# Patient Record
Sex: Male | Born: 1985 | Race: White | Hispanic: No | Marital: Single | Smoking: Current every day smoker
Health system: Southern US, Community
[De-identification: ages and names within clinical notes are randomized; demographics above are authoritative.]

## PROBLEM LIST (undated history)

## (undated) DIAGNOSIS — K4090 Unilateral inguinal hernia, without obstruction or gangrene, not specified as recurrent: Secondary | ICD-10-CM

## (undated) HISTORY — PX: TONSILLECTOMY: SUR1361

---

## 2014-12-20 DIAGNOSIS — N433 Hydrocele, unspecified: Secondary | ICD-10-CM | POA: Insufficient documentation

## 2015-01-10 DIAGNOSIS — K4090 Unilateral inguinal hernia, without obstruction or gangrene, not specified as recurrent: Secondary | ICD-10-CM | POA: Insufficient documentation

## 2015-12-01 ENCOUNTER — Emergency Department (HOSPITAL_COMMUNITY)
Admission: EM | Admit: 2015-12-01 | Discharge: 2015-12-02 | Disposition: A | Discharge status: Home / Self Care | Payer: BLUE CROSS/BLUE SHIELD | Attending: Emergency Medicine | Admitting: Emergency Medicine

## 2015-12-01 ENCOUNTER — Encounter (HOSPITAL_COMMUNITY): Payer: Self-pay | Admitting: *Deleted

## 2015-12-01 ENCOUNTER — Emergency Department (HOSPITAL_COMMUNITY): Payer: BLUE CROSS/BLUE SHIELD

## 2015-12-01 DIAGNOSIS — Y9389 Activity, other specified: Secondary | ICD-10-CM | POA: Insufficient documentation

## 2015-12-01 DIAGNOSIS — M542 Cervicalgia: Secondary | ICD-10-CM | POA: Insufficient documentation

## 2015-12-01 DIAGNOSIS — S80212A Abrasion, left knee, initial encounter: Secondary | ICD-10-CM | POA: Insufficient documentation

## 2015-12-01 DIAGNOSIS — Y999 Unspecified external cause status: Secondary | ICD-10-CM | POA: Diagnosis not present

## 2015-12-01 DIAGNOSIS — F17210 Nicotine dependence, cigarettes, uncomplicated: Secondary | ICD-10-CM | POA: Diagnosis not present

## 2015-12-01 DIAGNOSIS — T148 Other injury of unspecified body region: Secondary | ICD-10-CM | POA: Diagnosis not present

## 2015-12-01 DIAGNOSIS — S30810A Abrasion of lower back and pelvis, initial encounter: Secondary | ICD-10-CM | POA: Insufficient documentation

## 2015-12-01 DIAGNOSIS — S63502A Unspecified sprain of left wrist, initial encounter: Secondary | ICD-10-CM | POA: Diagnosis not present

## 2015-12-01 DIAGNOSIS — M25572 Pain in left ankle and joints of left foot: Secondary | ICD-10-CM | POA: Diagnosis not present

## 2015-12-01 DIAGNOSIS — S6992XA Unspecified injury of left wrist, hand and finger(s), initial encounter: Secondary | ICD-10-CM | POA: Diagnosis present

## 2015-12-01 DIAGNOSIS — Y92410 Unspecified street and highway as the place of occurrence of the external cause: Secondary | ICD-10-CM | POA: Diagnosis not present

## 2015-12-01 DIAGNOSIS — T148XXA Other injury of unspecified body region, initial encounter: Secondary | ICD-10-CM

## 2015-12-01 HISTORY — DX: Unilateral inguinal hernia, without obstruction or gangrene, not specified as recurrent: K40.90
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
## 2015-12-01 NOTE — ED Notes (Signed)
Patient transported to X-ray 
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
## 2015-12-02 MED ORDER — CYCLOBENZAPRINE HCL 10 MG PO TABS: 10.0000 mg | ORAL_TABLET | Freq: Three times a day (TID) | ORAL | Status: DC | PRN

## 2015-12-02 MED ORDER — TRAMADOL HCL 50 MG PO TABS: 50.0000 mg | ORAL_TABLET | Freq: Four times a day (QID) | ORAL | Status: DC | PRN

## 2015-12-02 NOTE — Discharge Instructions (Signed)
Wrist Sprain A wrist sprain is a stretch or tear in the strong, fibrous tissues (ligaments) that connect your wrist bones. The ligaments of your wrist may be easily sprained. There are three types of wrist sprains.  Grade 1. The ligament is not stretched or torn, but the sprain causes pain.  Grade 2. The ligament is stretched or partially torn. You may be able to move your wrist, but not very much.  Grade 3. The ligament or muscle completely tears. You may find it difficult or extremely painful to move your wrist even a little. CAUSES Often, wrist sprains are a result of a fall or an injury. The force of the impact causes the fibers of your ligament to stretch too much or tear. Common causes of wrist sprains include:  Overextending your wrist while catching a ball with your hands.  Repetitive or strenuous extension or bending of your wrist.  Landing on your hand during a fall. RISK FACTORS  Having previous wrist injuries.  Playing contact sports, such as boxing or wrestling.  Participating in activities in which falling is common.  Having poor wrist strength and flexibility. SIGNS AND SYMPTOMS  Wrist pain.  Wrist tenderness.  Inflammation or bruising of the wrist area.  Hearing a "pop" or feeling a tear at the time of the injury.  Decreased wrist movement due to pain, stiffness, or weakness. DIAGNOSIS Your health care provider will examine your wrist. In some cases, an X-ray will be taken to make sure you did not break any bones. If your health care provider thinks that you tore a ligament, he or she may order an MRI of your wrist. TREATMENT Treatment involves resting and icing your wrist. You may also need to take pain medicines to help lessen pain and inflammation. Your health care provider may recommend keeping your wrist still (immobilized) with a splint to help your sprain heal. When the splint is no longer necessary, you may need to perform strengthening and stretching  exercises. These exercises help you to regain strength and full range of motion in your wrist. Surgery is not usually needed for wrist sprains unless the ligament completely tears. HOME CARE INSTRUCTIONS  Rest your wrist. Do not do things that cause pain.  Wear your wrist splint as directed by your health care provider.  Take medicines only as directed by your health care provider.  To ease pain and swelling, apply ice to the injured area.  Put ice in a plastic bag.  Place a towel between your skin and the bag.  Leave the ice on for 20 minutes, 2-3 times a day. SEEK MEDICAL CARE IF:  Your pain, discomfort, or swelling gets worse even with treatment.  You feel sudden numbness in your hand.   This information is not intended to replace advice given to you by your health care provider. Make sure you discuss any questions you have with your health care provider.   Document Released: 01/18/2014 Document Reviewed: 01/18/2014 Elsevier Interactive Patient Education 2016 [employer] Contusion A contusion is a deep bruise. Contusions are the result of a blunt injury to tissues and muscle fibers under the skin. The injury causes bleeding under the skin. The skin overlying the contusion may turn blue, purple, or yellow. Minor injuries will give you a painless contusion, but more severe contusions may stay painful and swollen for a few weeks.  CAUSES  This condition is usually caused by a blow, trauma, or direct force to an area of the body. SYMPTOMS  Symptoms of this condition include:  Swelling of the injured area.  Pain and tenderness in the injured area.  Discoloration. The area may have redness and then turn blue, purple, or yellow. DIAGNOSIS  This condition is diagnosed based on a physical exam and medical history. An X-ray, CT scan, or MRI may be needed to determine if there are any associated injuries, such as broken bones (fractures). TREATMENT  Specific treatment for this  condition depends on what area of the body was injured. In general, the best treatment for a contusion is resting, icing, applying pressure to (compression), and elevating the injured area. This is often called the RICE strategy. Over-the-counter anti-inflammatory medicines may also be recommended for pain control.  HOME CARE INSTRUCTIONS   Rest the injured area.  If directed, apply ice to the injured area:  Put ice in a plastic bag.  Place a towel between your skin and the bag.  Leave the ice on for 20 minutes, 2-3 times per day.  If directed, apply light compression to the injured area using an elastic bandage. Make sure the bandage is not wrapped too tightly. Remove and reapply the bandage as directed by your health care provider.  If possible, raise (elevate) the injured area above the level of your heart while you are sitting or lying down.  Take over-the-counter and prescription medicines only as told by your health care provider. SEEK MEDICAL CARE IF:  Your symptoms do not improve after several days of treatment.  Your symptoms get worse.  You have difficulty moving the injured area. SEEK IMMEDIATE MEDICAL CARE IF:   You have severe pain.  You have numbness in a hand or foot.  Your hand or foot turns pale or cold.   This information is not intended to replace advice given to you by your health care provider. Make sure you discuss any questions you have with your health care provider.   Document Released: 02/24/2005 Document Revised: 02/05/2015 Document Reviewed: 10/02/2014 Elsevier Interactive Patient Education 2016 Elsevier Inc. Abrasion An abrasion is a cut or scrape on the outer surface of your skin. An abrasion does not extend through all of the layers of your skin. It is important to care for your abrasion properly to prevent infection. CAUSES Most abrasions are caused by falling on or gliding across the ground or another surface. When your skin rubs on something,  the outer and inner layer of skin rubs off.  SYMPTOMS A cut or scrape is the main symptom of this condition. The scrape may be bleeding, or it may appear red or pink. If there was an associated fall, there may be an underlying bruise. DIAGNOSIS An abrasion is diagnosed with a physical exam. TREATMENT Treatment for this condition depends on how large and deep the abrasion is. Usually, your abrasion will be cleaned with water and mild soap. This removes any dirt or debris that may be stuck. An antibiotic ointment may be applied to the abrasion to help prevent infection. A bandage (dressing) may be placed on the abrasion to keep it clean. You may also need a tetanus shot. HOME CARE INSTRUCTIONS Medicines  Take or apply medicines only as directed by your health care provider.  If you were prescribed an antibiotic ointment, finish all of it even if you start to feel better. Wound Care  Clean the wound with mild soap and water 2-3 times per day or as directed by your health care provider. Pat your wound dry with a clean towel.  Do not rub it.  There are many different ways to close and cover a wound. Follow instructions from your health care provider about:  Wound care.  Dressing changes and removal.  Check your wound every day for signs of infection. Watch for:  Redness, swelling, or pain.  Fluid, blood, or pus. General Instructions  Keep the dressing dry as directed by your health care provider. Do not take baths, swim, use a hot tub, or do anything that would put your wound underwater until your health care provider approves.  If there is swelling, raise (elevate) the injured area above the level of your heart while you are sitting or lying down.  Keep all follow-up visits as directed by your health care provider. This is important. SEEK MEDICAL CARE IF:  You received a tetanus shot and you have swelling, severe pain, redness, or bleeding at the injection site.  Your pain is not  controlled with medicine.  You have increased redness, swelling, or pain at the site of your wound. SEEK IMMEDIATE MEDICAL CARE IF:  You have a red streak going away from your wound.  You have a fever.  You have fluid, blood, or pus coming from your wound.  You notice a bad smell coming from your wound or your dressing.   This information is not intended to replace advice given to you by your health care provider. Make sure you discuss any questions you have with your health care provider.   Document Released: 02/24/2005 Document Revised: 02/05/2015 Document Reviewed: 05/15/2014 Elsevier Interactive Patient Education [employer]2016 Elsevier Inc[employer]
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
## 2022-07-26 ENCOUNTER — Ambulatory Visit (INDEPENDENT_AMBULATORY_CARE_PROVIDER_SITE_OTHER): Payer: 59 | Admitting: Family

## 2022-07-26 ENCOUNTER — Encounter: Payer: Self-pay | Admitting: Family

## 2022-07-26 VITALS — BP 134/88 | HR 92 | Temp 97.3°F | Resp 20 | Ht 69.0 in | Wt 227.0 lb

## 2022-07-26 DIAGNOSIS — Z1159 Encounter for screening for other viral diseases: Secondary | ICD-10-CM

## 2022-07-26 DIAGNOSIS — Z6833 Body mass index (BMI) 33.0-33.9, adult: Secondary | ICD-10-CM

## 2022-07-26 DIAGNOSIS — Z789 Other specified health status: Secondary | ICD-10-CM

## 2022-07-26 DIAGNOSIS — Z7689 Persons encountering health services in other specified circumstances: Secondary | ICD-10-CM

## 2022-07-26 DIAGNOSIS — Z23 Encounter for immunization: Secondary | ICD-10-CM

## 2022-07-26 DIAGNOSIS — F17210 Nicotine dependence, cigarettes, uncomplicated: Secondary | ICD-10-CM

## 2022-07-26 DIAGNOSIS — Z113 Encounter for screening for infections with a predominantly sexual mode of transmission: Secondary | ICD-10-CM

## 2022-07-26 DIAGNOSIS — Z13220 Encounter for screening for lipoid disorders: Secondary | ICD-10-CM

## 2022-07-26 NOTE — Progress Notes (Signed)
Provider: Marlowe Sax FNP-C   Conley Delisle, Nelda Bucks, NP  Patient Care Team: Joie Hipps, Nelda Bucks, NP as PCP - General (Family Medicine)  Extended Emergency Contact Information Primary Emergency Contact: Francie Massing [place] Phone: [phone] Relation: Mother Secondary Emergency Contact: Schneider,Remick [place] Phone: [phone] Relation: Relative Preferred language: English Interpreter needed? No  Code Status:  Full Code  Goals of care: Advanced Directive information    12/01/2015   10:15 PM  Advanced Directives  Does Patient Have a Medical Advance Directive? No  Would patient like information on creating a medical advance directive? No - patient declined information     Chief Complaint  Patient presents with   New Patient (Initial Visit)    Patient presents today for a new patient.    HPI:  Pt is a 37 y.o. male seen today establish care here at [facility].States had no chronic medical condition. States trying to stay health.  Has changed his diet recently more veggies,lots of milk and yogurt since new year.  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   Drinks alcohol twice a month.Has cut down states used to drink a lot.  Has also cut down on smoking cigarettes down to 1/2 pack per day for the past 16 yrs.   Has no know allergies but has allergies to dust,Hay,grass,pollen and cat litter.itching ,sneezing and difficult breathing with exposure to allergens.No anaphylaxis.  Past Medical History:  Diagnosis Date   Inguinal hernia    Past Surgical History:  Procedure Laterality Date   TONSILLECTOMY      Allergies  Allergen Reactions   Cat Hair Extract     Cat litter causes itching and sneezing    Dust Mite Extract     Itching and sneezing    Grass Pollen(K-O-R-T-Swt Vern) Itching   Pollen Extract-Tree Extract [Pollen Extract] Itching    Sneezing     Allergies as  of 07/26/2022       Reactions   Cat Hair Extract    Cat litter causes itching and sneezing    Dust Mite Extract    Itching and sneezing    Grass Pollen(k-o-r-t-swt Vern) Itching   Pollen Extract-tree Extract [pollen Extract] Itching   Sneezing         Medication List        Accurate as of July 26, 2022 11:10 PM. If you have any questions, ask your nurse or doctor.          STOP taking these medications    cyclobenzaprine 10 MG tablet Commonly known as: FLEXERIL Stopped by: Sandrea Hughs, NP   traMADol 50 MG tablet Commonly known as: ULTRAM Stopped by: Sandrea Hughs, NP        Review of Systems  Constitutional:  Negative for appetite change, chills, fatigue, fever and unexpected weight change.  HENT:  Negative for congestion, dental problem, ear discharge, ear pain, facial swelling, hearing loss, nosebleeds, postnasal drip, rhinorrhea, sinus pressure, sinus pain, sneezing, sore throat, tinnitus and trouble swallowing.   Eyes:  Negative for pain, discharge, redness, itching and visual disturbance.  Respiratory:  Negative for cough, chest tightness, shortness of breath and wheezing.   Cardiovascular:  Negative for chest pain, palpitations and leg swelling.  Gastrointestinal:  Negative for abdominal distention, abdominal pain, blood in stool, constipation, diarrhea, nausea and vomiting.  Endocrine: Negative for cold intolerance, heat intolerance, polydipsia, polyphagia and polyuria.  Genitourinary:  Negative for difficulty urinating, dysuria, flank pain, frequency and urgency.  Musculoskeletal:  Negative for arthralgias, back pain, gait problem, joint swelling, myalgias, neck pain and neck stiffness.  Skin:  Negative for color change, pallor, rash and wound.  Neurological:  Negative for dizziness, syncope, speech difficulty, weakness, light-headedness, numbness and headaches.  Hematological:  Does not bruise/bleed easily.  Psychiatric/Behavioral:  Negative for  agitation, behavioral problems, confusion, hallucinations, self-injury, sleep disturbance and suicidal ideas. The patient is not nervous/anxious.     Immunization History  Administered Date(s) Administered   Td 07/09/2004   Tdap 11/22/2011, 07/26/2022   Pertinent  Health Maintenance Due  Topic Date Due   INFLUENZA VACCINE  08/29/2022 (Originally 12/29/2021)       No data to display         Functional Status Survey:    Vitals:   07/26/22 1358  BP: 134/88  Pulse: 92  Resp: 20  Temp: (!) 97.3 F (36.3 C)  SpO2: 97%  Weight: 227 lb (103 kg)  Height: '5\' 9"'$  (1.753 m)   Body mass index is 33.52 kg/m. Physical Exam Vitals reviewed.  Constitutional:      General: He is not in acute distress.    Appearance: Normal appearance. He is obese. He is not ill-appearing or diaphoretic.  HENT:     Head: Normocephalic.     Right Ear: Tympanic membrane, ear canal and external ear normal. There is no impacted cerumen.     Left Ear: Tympanic membrane, ear canal and external ear normal. There is no impacted cerumen.     Nose: Nose normal. No congestion or rhinorrhea.     Mouth/Throat:     Mouth: Mucous membranes are moist.     Pharynx: Oropharynx is clear. No oropharyngeal exudate or posterior oropharyngeal erythema.  Eyes:     General: No scleral icterus.       Right eye: No discharge.        Left eye: No discharge.     Extraocular Movements: Extraocular movements intact.     Conjunctiva/sclera: Conjunctivae normal.     Pupils: Pupils are equal, round, and reactive to light.  Neck:     Vascular: No carotid bruit.  Cardiovascular:     Rate and Rhythm: Normal rate and regular rhythm.     Pulses: Normal pulses.     Heart sounds: Normal heart sounds. No murmur heard.    No friction rub. No gallop.  Pulmonary:     Effort: Pulmonary effort is normal. No respiratory distress.     Breath sounds: Normal breath sounds. No wheezing, rhonchi or rales.  Chest:     Chest wall: No  tenderness.  Abdominal:     General: Bowel sounds are normal. There is no distension.     Palpations: Abdomen is soft. There is no mass.     Tenderness: There is no abdominal tenderness. There is no right CVA tenderness, left CVA tenderness, guarding or rebound.  Musculoskeletal:        General: No swelling or tenderness. Normal range of motion.     Cervical back: Normal range of motion. No rigidity or tenderness.     Right lower leg: No edema.     Left lower leg: No edema.  Lymphadenopathy:     Cervical: No cervical adenopathy.  Skin:    General: Skin is warm and dry.     Coloration: Skin is not pale.     Findings: No bruising, erythema, lesion or rash.  Neurological:     Mental Status: He is alert and oriented to person, place, and time.     Cranial Nerves: No cranial nerve deficit.     Sensory: No sensory deficit.     Motor: No weakness.     Coordination: Coordination normal.     Gait: Gait normal.  Psychiatric:        Mood and Affect: Mood normal.        Speech: Speech normal.        Behavior: Behavior normal.        Thought Content: Thought content normal.        Judgment: Judgment normal.     Labs reviewed: No results for input(s): "NA", "K", "CL", "CO2", "GLUCOSE", "BUN", "CREATININE", "CALCIUM", "MG", "PHOS" in the last 8760 hours. No results for input(s): "AST", "ALT", "ALKPHOS", "BILITOT", "PROT", "ALBUMIN" in the last 8760 hours. No results for input(s): "WBC", "NEUTROABS", "HGB", "HCT", "MCV", "PLT" in the last 8760 hours. No results found for: "TSH" No results found for: "HGBA1C" No results found for: "CHOL", "HDL", "LDLCALC", "LDLDIRECT", "TRIG", "CHOLHDL"  Significant Diagnostic Results in last 30 days:  No results found.  Assessment/Plan 1. Encounter to establish care All available records reviewed, due for tetanus vaccine agrees to receive the vaccines this visit.  Also recommend to schedule for fasting lab work has not had labs for several years  2.  Alcohol use Alcohol use cessation advised. - COMPLETE METABOLIC PANEL WITH GFR; Future - CBC with Differential/Platelet; Future  3. Continuous dependence on cigarette smoking Currently smoking half a pack per day for the past 16 years.  Cigarette smoking cessation advised.  Will continue with over-the-counter nicotine patch will notify provider on treatment prescription.  4. Screening for hyperlipidemia No previous LDL for review Dietary modification and exercise advised - Lipid panel; Future  5. Body mass index (BMI) of 33.0-33.9 in adult BMI 33.52 no comorbidities reported Dietary modification and exercise at least 3 times per week for 30 minutes advised   6. Encounter for hepatitis C screening test for low risk patient Low risk - Hepatitis C antibody; Future  7. Screen for STD (sexually transmitted disease) No high risk behaviors reported - HIV Antibody (routine testing w rflx); Future  8. Need for diphtheria-tetanus-pertussis (Tdap) vaccine Tdap administered by M.Porsha,CMA no reaction reported  - Tdap vaccine greater than or equal to 7yo IM  Family/ staff Communication: Reviewed plan of care with patient verbalized understanding.   Labs/tests ordered:  - CBC with Differential/Platelet - CMP with eGFR([facility]) - Lipid panel - Hep C Antibody - HIV Antibody (routine testing w rflx)  Next Appointment : Return in about 1 year (around 07/27/2023) for annual Physical examination.Fasting labs tomorrow .  Sandrea Hughs, NP

## 2022-07-27 ENCOUNTER — Other Ambulatory Visit: Payer: 59

## 2022-07-27 DIAGNOSIS — Z13220 Encounter for screening for lipoid disorders: Secondary | ICD-10-CM

## 2022-07-27 DIAGNOSIS — Z113 Encounter for screening for infections with a predominantly sexual mode of transmission: Secondary | ICD-10-CM | POA: Diagnosis not present

## 2022-07-27 DIAGNOSIS — Z789 Other specified health status: Secondary | ICD-10-CM

## 2022-07-27 DIAGNOSIS — Z1159 Encounter for screening for other viral diseases: Secondary | ICD-10-CM

## 2022-07-27 LAB — CBC WITH DIFFERENTIAL/PLATELET
Basophils Absolute: 36 cells/uL (ref 0–200)
Eosinophils Absolute: 531 cells/uL — ABNORMAL HIGH (ref 15–500)
HCT: 48.3 % (ref 38.5–50.0)
Hemoglobin: 17 g/dL (ref 13.2–17.1)
Lymphs Abs: 2340 cells/uL (ref 850–3900)
MCH: 31.1 pg (ref 27.0–33.0)
Monocytes Relative: 5.9 %
Platelets: 321 10*3/uL (ref 140–400)
RBC: 5.47 10*6/uL (ref 4.20–5.80)
Total Lymphocyte: 26 %

## 2022-07-28 LAB — COMPLETE METABOLIC PANEL WITH GFR
AG Ratio: 1.7 (calc) (ref 1.0–2.5)
ALT: 20 U/L (ref 9–46)
AST: 16 U/L (ref 10–40)
Albumin: 4.3 g/dL (ref 3.6–5.1)
Alkaline phosphatase (APISO): 51 U/L (ref 36–130)
BUN: 10 mg/dL (ref 7–25)
CO2: 20 mmol/L (ref 20–32)
Calcium: 9.7 mg/dL (ref 8.6–10.3)
Chloride: 107 mmol/L (ref 98–110)
Creat: 1 mg/dL (ref 0.60–1.26)
Globulin: 2.5 g/dL (calc) (ref 1.9–3.7)
Glucose, Bld: 97 mg/dL (ref 65–99)
Potassium: 4.4 mmol/L (ref 3.5–5.3)
Sodium: 138 mmol/L (ref 135–146)
Total Bilirubin: 0.4 mg/dL (ref 0.2–1.2)
Total Protein: 6.8 g/dL (ref 6.1–8.1)
eGFR: 101 mL/min/{1.73_m2} (ref >=60)

## 2022-07-28 LAB — CBC WITH DIFFERENTIAL/PLATELET
Absolute Monocytes: 531 cells/uL (ref 200–950)
Basophils Relative: 0.4 %
Eosinophils Relative: 5.9 %
MCHC: 35.2 g/dL (ref 32.0–36.0)
MCV: 88.3 fL (ref 80.0–100.0)
MPV: 10.3 fL (ref 7.5–12.5)
Neutro Abs: 5562 cells/uL (ref 1500–7800)
Neutrophils Relative %: 61.8 %
RDW: 12.6 % (ref 11.0–15.0)
WBC: 9 10*3/uL (ref 3.8–10.8)

## 2022-07-28 LAB — LIPID PANEL
Cholesterol: 231 mg/dL — ABNORMAL HIGH (ref <200)
HDL: 42 mg/dL (ref >=40)
LDL Cholesterol (Calc): 154 mg/dL (calc) — ABNORMAL HIGH
Non-HDL Cholesterol (Calc): 189 mg/dL (calc) — ABNORMAL HIGH (ref <130)
Total CHOL/HDL Ratio: 5.5 (calc) — ABNORMAL HIGH (ref <5.0)
Triglycerides: 209 mg/dL — ABNORMAL HIGH (ref <150)

## 2022-07-28 LAB — HIV ANTIBODY (ROUTINE TESTING W REFLEX): HIV 1&2 Ab, 4th Generation: NONREACTIVE

## 2022-07-28 LAB — HEPATITIS C ANTIBODY: Hepatitis C Ab: NONREACTIVE

## 2023-01-26 ENCOUNTER — Other Ambulatory Visit: Payer: Self-pay | Admitting: Family

## 2023-01-26 DIAGNOSIS — Z789 Other specified health status: Secondary | ICD-10-CM

## 2023-01-26 DIAGNOSIS — E782 Mixed hyperlipidemia: Secondary | ICD-10-CM

## 2023-02-04 ENCOUNTER — Other Ambulatory Visit: Payer: 59

## 2023-02-04 DIAGNOSIS — E782 Mixed hyperlipidemia: Secondary | ICD-10-CM

## 2023-02-04 DIAGNOSIS — Z789 Other specified health status: Secondary | ICD-10-CM

## 2023-02-05 LAB — COMPLETE METABOLIC PANEL WITH GFR
AG Ratio: 1.6 (calc) (ref 1.0–2.5)
ALT: 37 U/L (ref 9–46)
AST: 24 U/L (ref 10–40)
Albumin: 4.6 g/dL (ref 3.6–5.1)
Alkaline phosphatase (APISO): 50 U/L (ref 36–130)
BUN: 13 mg/dL (ref 7–25)
CO2: 27 mmol/L (ref 20–32)
Calcium: 10.6 mg/dL — ABNORMAL HIGH (ref 8.6–10.3)
Chloride: 103 mmol/L (ref 98–110)
Creat: 1.08 mg/dL (ref 0.60–1.26)
Globulin: 2.9 g/dL (ref 1.9–3.7)
Glucose, Bld: 91 mg/dL (ref 65–99)
Potassium: 4.3 mmol/L (ref 3.5–5.3)
Sodium: 139 mmol/L (ref 135–146)
Total Bilirubin: 1 mg/dL (ref 0.2–1.2)
Total Protein: 7.5 g/dL (ref 6.1–8.1)
eGFR: 92 mL/min/{1.73_m2} (ref >=60)

## 2023-02-05 LAB — CBC WITH DIFFERENTIAL/PLATELET
Absolute Monocytes: 616 {cells}/uL (ref 200–950)
Basophils Absolute: 23 {cells}/uL (ref 0–200)
Basophils Relative: 0.3 %
Eosinophils Absolute: 408 {cells}/uL (ref 15–500)
Eosinophils Relative: 5.3 %
HCT: 49.1 % (ref 38.5–50.0)
Hemoglobin: 17.2 g/dL — ABNORMAL HIGH (ref 13.2–17.1)
Lymphs Abs: 2526 {cells}/uL (ref 850–3900)
MCH: 31.4 pg (ref 27.0–33.0)
MCHC: 35 g/dL (ref 32.0–36.0)
MCV: 89.6 fL (ref 80.0–100.0)
MPV: 10.3 fL (ref 7.5–12.5)
Monocytes Relative: 8 %
Neutro Abs: 4127 {cells}/uL (ref 1500–7800)
Neutrophils Relative %: 53.6 %
Platelets: 276 10*3/uL (ref 140–400)
RBC: 5.48 10*6/uL (ref 4.20–5.80)
RDW: 12.8 % (ref 11.0–15.0)
Total Lymphocyte: 32.8 %
WBC: 7.7 10*3/uL (ref 3.8–10.8)

## 2023-02-05 LAB — LIPID PANEL
Cholesterol: 263 mg/dL — ABNORMAL HIGH (ref <200)
HDL: 43 mg/dL (ref >=40)
LDL Cholesterol (Calc): 180 mg/dL — ABNORMAL HIGH
Non-HDL Cholesterol (Calc): 220 mg/dL — ABNORMAL HIGH (ref <130)
Total CHOL/HDL Ratio: 6.1 (calc) — ABNORMAL HIGH (ref <5.0)
Triglycerides: 213 mg/dL — ABNORMAL HIGH (ref <150)

## 2023-02-05 LAB — TSH: TSH: 2.51 m[IU]/L (ref 0.40–4.50)

## 2023-02-08 ENCOUNTER — Ambulatory Visit (INDEPENDENT_AMBULATORY_CARE_PROVIDER_SITE_OTHER): Payer: 59 | Admitting: Family

## 2023-02-08 ENCOUNTER — Encounter: Payer: Self-pay | Admitting: Family

## 2023-02-08 VITALS — BP 120/80 | HR 73 | Temp 97.9°F | Resp 14 | Ht 69.0 in | Wt 230.0 lb

## 2023-02-08 DIAGNOSIS — Z789 Other specified health status: Secondary | ICD-10-CM | POA: Diagnosis not present

## 2023-02-08 DIAGNOSIS — E782 Mixed hyperlipidemia: Secondary | ICD-10-CM | POA: Diagnosis not present

## 2023-02-08 DIAGNOSIS — F17209 Nicotine dependence, unspecified, with unspecified nicotine-induced disorders: Secondary | ICD-10-CM

## 2023-02-08 DIAGNOSIS — Z6833 Body mass index (BMI) 33.0-33.9, adult: Secondary | ICD-10-CM | POA: Diagnosis not present

## 2023-02-08 MED ORDER — BUPROPION HCL ER (SR) 150 MG PO TB12: 150.0000 mg | ORAL_TABLET | Freq: Every day | ORAL | 1 refills | Status: DC

## 2023-02-08 NOTE — Progress Notes (Signed)
Provider: Richarda Blade FNP-C   Derhonda Eastlick, Donalee Citrin, NP  Patient Care Team: Donnita Farina, Donalee Citrin, NP as PCP - General (Family Medicine)  Extended Emergency Contact Information Primary Emergency Contact: Axel Filler [place] Home Phone: [phone] Relation: Mother Secondary Emergency Contact: Schneider,Remick Mobile Phone: [phone] Relation: Relative Preferred language: English Interpreter needed? No  Code Status:  Full Code  Goals of care: Advanced Directive information    02/08/2023    1:06 PM  Advanced Directives  Does Patient Have a Medical Advance Directive? No     Chief Complaint  Patient presents with   Medical Management of Chronic Issues    6 months follow up     HPI:  Pt is a 37 y.o. male seen today for 6 months for follow up medical management of chronic diseases.  Recent lab results   Hyperlipidemia - TC 263 previous 231,TRG 213 previous 209 and LDL 180 up from 154   Hemoglobin 17.2   Calcium 10.6 drinks milk eats lots cheese daily.  States continues to smoke 1/2 pack per day. Also drinks alcohol couple of times per month but has cut down. He has also stopped using cocaine 3 months ago.still smoke marijuana every now and then.   Due for COVID-19 and Influenza   Past Medical History:  Diagnosis Date   Inguinal hernia    Past Surgical History:  Procedure Laterality Date   TONSILLECTOMY      Allergies  Allergen Reactions   Cat Hair Extract     Cat litter causes itching and sneezing    Dust Mite Extract     Itching and sneezing    Grass Pollen(K-O-R-T-Swt Vern) Itching   Pollen Extract-Tree Extract [Pollen Extract] Itching    Sneezing     Allergies as of 02/08/2023       Reactions   Cat Hair Extract    Cat litter causes itching and sneezing    Dust Mite Extract    Itching and sneezing    Grass Pollen(k-o-r-t-swt Vern) Itching   Pollen Extract-tree Extract [pollen Extract] Itching   Sneezing         Medication  List        Accurate as of February 08, 2023 10:58 PM. If you have any questions, ask your nurse or doctor.          buPROPion 150 MG 12 hr tablet Commonly known as: WELLBUTRIN SR Take 1 tablet (150 mg total) by mouth daily. Started by: Donalee Citrin Feige Lowdermilk   cetirizine 10 MG tablet Commonly known as: ZYRTEC Take 10 mg by mouth daily.        Review of Systems  Constitutional:  Negative for appetite change, chills, fatigue, fever and unexpected weight change.  HENT:  Negative for congestion, dental problem, ear discharge, ear pain, facial swelling, hearing loss, nosebleeds, postnasal drip, rhinorrhea, sinus pressure, sinus pain, sneezing, sore throat, tinnitus and trouble swallowing.   Eyes:  Negative for pain, discharge, redness, itching and visual disturbance.  Respiratory:  Negative for cough, chest tightness, shortness of breath and wheezing.   Cardiovascular:  Negative for chest pain, palpitations and leg swelling.  Gastrointestinal:  Negative for abdominal distention, abdominal pain, blood in stool, constipation, diarrhea, nausea and vomiting.  Endocrine: Negative for cold intolerance, heat intolerance, polydipsia, polyphagia and polyuria.  Genitourinary:  Negative for difficulty urinating, dysuria, flank pain, frequency and urgency.  Musculoskeletal:  Negative for arthralgias, back pain, gait problem, joint swelling, myalgias, neck pain and neck stiffness.  Skin:  Negative for color change, pallor, rash and wound.  Neurological:  Negative for dizziness, syncope, speech difficulty, weakness, light-headedness, numbness and headaches.  Hematological:  Does not bruise/bleed easily.  Psychiatric/Behavioral:  Negative for agitation, behavioral problems, confusion, hallucinations, self-injury, sleep disturbance and suicidal ideas. The patient is not nervous/anxious.     Immunization History  Administered Date(s) Administered   Td 07/09/2004   Tdap 11/22/2011, 07/26/2022    Pertinent  Health Maintenance Due  Topic Date Due   INFLUENZA VACCINE  08/29/2023 (Originally 12/30/2022)      02/08/2023    1:06 PM  Fall Risk  Falls in the past year? 0   Functional Status Survey:    Vitals:   02/08/23 1307  BP: 120/80  Pulse: 73  Resp: 14  Temp: 97.9 F (36.6 C)  SpO2: 97%  Weight: 230 lb (104.3 kg)  Height: 5\' 9"  (1.753 m)   Body mass index is 33.97 kg/m. Physical Exam Vitals reviewed.  Constitutional:      General: He is not in acute distress.    Appearance: Normal appearance. He is obese. He is not ill-appearing or diaphoretic.  HENT:     Head: Normocephalic.     Right Ear: Tympanic membrane, ear canal and external ear normal. There is no impacted cerumen.     Left Ear: Tympanic membrane, ear canal and external ear normal. There is no impacted cerumen.     Nose: Nose normal. No congestion or rhinorrhea.     Mouth/Throat:     Mouth: Mucous membranes are moist.     Pharynx: Oropharynx is clear. No oropharyngeal exudate or posterior oropharyngeal erythema.  Eyes:     General: No scleral icterus.       Right eye: No discharge.        Left eye: No discharge.     Extraocular Movements: Extraocular movements intact.     Conjunctiva/sclera: Conjunctivae normal.     Pupils: Pupils are equal, round, and reactive to light.  Neck:     Vascular: No carotid bruit.  Cardiovascular:     Rate and Rhythm: Normal rate and regular rhythm.     Pulses: Normal pulses.     Heart sounds: Normal heart sounds. No murmur heard.    No friction rub. No gallop.  Pulmonary:     Effort: Pulmonary effort is normal. No respiratory distress.     Breath sounds: Normal breath sounds. No wheezing, rhonchi or rales.  Chest:     Chest wall: No tenderness.  Abdominal:     General: Bowel sounds are normal. There is no distension.     Palpations: Abdomen is soft. There is no mass.     Tenderness: There is no abdominal tenderness. There is no right CVA tenderness, left CVA  tenderness, guarding or rebound.  Musculoskeletal:        General: No swelling or tenderness. Normal range of motion.     Cervical back: Normal range of motion. No rigidity or tenderness.     Right lower leg: No edema.     Left lower leg: No edema.  Lymphadenopathy:     Cervical: No cervical adenopathy.  Skin:    General: Skin is warm and dry.     Coloration: Skin is not pale.     Findings: No bruising, erythema, lesion or rash.  Neurological:     Mental Status: He is alert and oriented to person, place, and time.     Cranial Nerves: No cranial nerve deficit.  Sensory: No sensory deficit.     Motor: No weakness.     Coordination: Coordination normal.     Gait: Gait normal.  Psychiatric:        Mood and Affect: Mood normal.        Speech: Speech normal.        Behavior: Behavior normal.        Thought Content: Thought content normal.        Judgment: Judgment normal.    Labs reviewed: Recent Labs    07/27/22 0000 02/04/23 1103  NA 138 139  K 4.4 4.3  CL 107 103  CO2 20 27  GLUCOSE 97 91  BUN 10 13  CREATININE 1.00 1.08  CALCIUM 9.7 10.6*   Recent Labs    07/27/22 0000 02/04/23 1103  AST 16 24  ALT 20 37  BILITOT 0.4 1.0  PROT 6.8 7.5   Recent Labs    07/27/22 0000 02/04/23 1103  WBC 9.0 7.7  NEUTROABS 5,562 4,127  HGB 17.0 17.2*  HCT 48.3 49.1  MCV 88.3 89.6  PLT 321 276   Lab Results  Component Value Date   TSH 2.51 02/04/2023   No results found for: "HGBA1C" Lab Results  Component Value Date   CHOL 263 (H) 02/04/2023   HDL 43 02/04/2023   LDLCALC 180 (H) 02/04/2023   TRIG 213 (H) 02/04/2023   CHOLHDL 6.1 (H) 02/04/2023    Significant Diagnostic Results in last 30 days:  No results found.  Assessment/Plan  1. Mixed hyperlipidemia LDL high  - dietary modification and exercise at three times at least 30 minutes   2. Tobacco use disorder, continuous Ready to quit smoking.discuss use of Bupropion as below.SE discussed  - buPROPion  (WELLBUTRIN SR) 150 MG 12 hr tablet; Take 1 tablet (150 mg total) by mouth daily.  Dispense: 30 tablet; Refill: 1  3. Body mass index (BMI) of 33.0-33.9 in adult BMI 33.97 with associates comorbidity - dietary modification and exercise as above    4. Alcohol use Alcohol use cessation advised.  Family/ staff Communication: Reviewed plan of care with patient verbalized understanding   Labs/tests ordered: None   Next Appointment : Return in about 1 month (around 03/10/2023) for smoking ceassation .   Caesar Bookman, NP

## 2023-03-10 ENCOUNTER — Ambulatory Visit: Payer: 59 | Admitting: Family

## 2023-03-11 ENCOUNTER — Ambulatory Visit (INDEPENDENT_AMBULATORY_CARE_PROVIDER_SITE_OTHER): Payer: 59 | Admitting: Family

## 2023-03-11 ENCOUNTER — Encounter: Payer: Self-pay | Admitting: Family

## 2023-03-11 VITALS — BP 120/80 | HR 63 | Temp 98.0°F | Resp 16 | Ht 69.0 in | Wt 227.8 lb

## 2023-03-11 DIAGNOSIS — F17209 Nicotine dependence, unspecified, with unspecified nicotine-induced disorders: Secondary | ICD-10-CM | POA: Diagnosis not present

## 2023-03-11 DIAGNOSIS — K219 Gastro-esophageal reflux disease without esophagitis: Secondary | ICD-10-CM | POA: Diagnosis not present

## 2023-03-11 DIAGNOSIS — F411 Generalized anxiety disorder: Secondary | ICD-10-CM | POA: Diagnosis not present

## 2023-03-11 MED ORDER — OMEPRAZOLE 20 MG PO CPDR: 20.0000 mg | DELAYED_RELEASE_CAPSULE | Freq: Every day | ORAL | 3 refills | Status: DC

## 2023-03-11 MED ORDER — BUPROPION HCL ER (SR) 150 MG PO TB12: 150.0000 mg | ORAL_TABLET | Freq: Every day | ORAL | 3 refills | Status: DC

## 2023-03-11 NOTE — Patient Instructions (Addendum)
Book resource:  - Depression way out by Brien Few  - SOS for emotion: managing anxiety and depression  - Telling yourself the truth

## 2023-03-11 NOTE — Progress Notes (Incomplete)
Provider: Richarda Blade FNP-C  Adreanna Fickel, Donalee Citrin, NP  Patient Care Team: Takirah Binford, Donalee Citrin, NP as PCP - General (Family Medicine)  Extended Emergency Contact Information Primary Emergency Contact: Axel Filler [place] Home Phone: [phone] Relation: Mother Secondary Emergency Contact: Schneider,Remick Mobile Phone: [phone] Relation: Relative Preferred language: English Interpreter needed? No  Code Status: Full Code  Goals of care: Advanced Directive information    03/11/2023    1:59 PM  Advanced Directives  Does Patient Have a Medical Advance Directive? No     Chief Complaint  Patient presents with  . Follow-up    HPI:  Pt is a 37 y.o. male seen today for an acute visit for evaluation of  Has been smoking less down to 7-8 cigarettes from 15-20 cigarettes.  Wellbutrin has helped with anxiety.Has had some fatigue though has been very busy at work.  Past Medical History:  Diagnosis Date  . Inguinal hernia    Past Surgical History:  Procedure Laterality Date  . TONSILLECTOMY      Allergies  Allergen Reactions  . Cat Hair Extract     Cat litter causes itching and sneezing   . Dust Mite Extract     Itching and sneezing   . Grass Pollen(K-O-R-T-Swt Vern) Itching  . Pollen Extract-Tree Extract [Pollen Extract] Itching    Sneezing     Outpatient Encounter Medications as of 03/11/2023  Medication Sig  . buPROPion (WELLBUTRIN SR) 150 MG 12 hr tablet Take 1 tablet (150 mg total) by mouth daily.  . cetirizine (ZYRTEC) 10 MG tablet Take 10 mg by mouth daily.   No facility-administered encounter medications on file as of 03/11/2023.    Review of Systems  Constitutional:  Negative for appetite change, chills, fatigue, fever and unexpected weight change.  Eyes:  Negative for pain, discharge, redness, itching and visual disturbance.  Respiratory:  Negative for cough, chest tightness, shortness of breath and wheezing.   Cardiovascular:   Negative for chest pain, palpitations and leg swelling.  Gastrointestinal:  Negative for abdominal distention, abdominal pain, blood in stool, constipation, diarrhea, nausea and vomiting.       Acid reflux  Musculoskeletal:  Negative for arthralgias, back pain, gait problem, joint swelling, myalgias, neck pain and neck stiffness.  Skin:  Negative for color change, pallor, rash and wound.  Neurological:  Negative for dizziness, syncope, speech difficulty, weakness, light-headedness, numbness and headaches.  Psychiatric/Behavioral:  Negative for agitation, behavioral problems, confusion, hallucinations, self-injury, sleep disturbance and suicidal ideas. The patient is not nervous/anxious.     Immunization History  Administered Date(s) Administered  . Td 07/09/2004  . Tdap 11/22/2011, 07/26/2022   Pertinent  Health Maintenance Due  Topic Date Due  . INFLUENZA VACCINE  08/29/2023 (Originally 12/30/2022)      02/08/2023    1:06 PM 03/11/2023    2:00 PM  Fall Risk  Falls in the past year? 0 0   Functional Status Survey:    Vitals:   03/11/23 1400  BP: 120/80  Pulse: 63  Resp: 16  Temp: 98 F (36.7 C)  SpO2: 97%  Weight: 227 lb 12.8 oz (103.3 kg)  Height: 5\' 9"  (1.753 m)   Body mass index is 33.64 kg/m. Physical Exam Vitals reviewed.  Constitutional:      General: He is not in acute distress.    Appearance: Normal appearance. He is obese. He is not ill-appearing or diaphoretic.  HENT:     Head: Normocephalic.     Mouth/Throat:  Mouth: Mucous membranes are moist.     Pharynx: Oropharynx is clear. No oropharyngeal exudate or posterior oropharyngeal erythema.  Eyes:     General: No scleral icterus.       Right eye: No discharge.        Left eye: No discharge.     Conjunctiva/sclera: Conjunctivae normal.     Pupils: Pupils are equal, round, and reactive to light.  Neck:     Vascular: No carotid bruit.  Cardiovascular:     Rate and Rhythm: Normal rate and regular  rhythm.     Pulses: Normal pulses.     Heart sounds: Normal heart sounds. No murmur heard.    No friction rub. No gallop.  Pulmonary:     Effort: Pulmonary effort is normal. No respiratory distress.     Breath sounds: Normal breath sounds. No wheezing, rhonchi or rales.  Chest:     Chest wall: No tenderness.  Abdominal:     General: Bowel sounds are normal. There is no distension.     Palpations: Abdomen is soft. There is no mass.     Tenderness: There is no abdominal tenderness. There is no right CVA tenderness, left CVA tenderness, guarding or rebound.  Musculoskeletal:        General: No swelling or tenderness. Normal range of motion.     Cervical back: Normal range of motion. No rigidity or tenderness.     Right lower leg: No edema.     Left lower leg: No edema.  Lymphadenopathy:     Cervical: No cervical adenopathy.  Skin:    General: Skin is warm and dry.     Coloration: Skin is not pale.     Findings: No bruising, erythema, lesion or rash.  Neurological:     Mental Status: He is alert and oriented to person, place, and time.     Cranial Nerves: No cranial nerve deficit.     Sensory: No sensory deficit.     Motor: No weakness.     Coordination: Coordination normal.     Gait: Gait normal.  Psychiatric:        Mood and Affect: Mood normal.        Speech: Speech normal.        Behavior: Behavior normal.        Thought Content: Thought content normal.        Judgment: Judgment normal.     Labs reviewed: Recent Labs    07/27/22 0000 02/04/23 1103  NA 138 139  K 4.4 4.3  CL 107 103  CO2 20 27  GLUCOSE 97 91  BUN 10 13  CREATININE 1.00 1.08  CALCIUM 9.7 10.6*   Recent Labs    07/27/22 0000 02/04/23 1103  AST 16 24  ALT 20 37  BILITOT 0.4 1.0  PROT 6.8 7.5   Recent Labs    07/27/22 0000 02/04/23 1103  WBC 9.0 7.7  NEUTROABS 5,562 4,127  HGB 17.0 17.2*  HCT 48.3 49.1  MCV 88.3 89.6  PLT 321 276   Lab Results  Component Value Date   TSH 2.51  02/04/2023   No results found for: "HGBA1C" Lab Results  Component Value Date   CHOL 263 (H) 02/04/2023   HDL 43 02/04/2023   LDLCALC 180 (H) 02/04/2023   TRIG 213 (H) 02/04/2023   CHOLHDL 6.1 (H) 02/04/2023    Significant Diagnostic Results in last 30 days:  No results found.  Assessment/Plan There are no diagnoses linked  to this encounter.   Family/ staff Communication: Reviewed plan of care with patient verbalized understanding   Labs/tests ordered: None   Next Appointment: Return if symptoms worsen or fail to improve.   Caesar Bookman, NP

## 2023-03-16 NOTE — Progress Notes (Signed)
Provider: Richarda Blade FNP-C  Anubis Fundora, Donalee Citrin, NP  Patient Care Team: Mliss Wedin, Donalee Citrin, NP as PCP - General (Family Medicine)  Extended Emergency Contact Information Primary Emergency Contact: Axel Filler [place] Home Phone: [phone] Relation: Mother Secondary Emergency Contact: Schneider,Remick Mobile Phone: [phone] Relation: Relative Preferred language: English Interpreter needed? No  Code Status: Full Code  Goals of care: Advanced Directive information    03/11/2023    1:59 PM  Advanced Directives  Does Patient Have a Medical Advance Directive? No     Chief Complaint  Patient presents with   Follow-up    HPI:  Pt is a 37 y.o. male seen today for an acute visit for follow up cigarette smoking.  He was here on 02/08/2023 for his 47-month follow-up.  He reported continues use of cigarette smoking half a pack per day.  Also drinking alcohol couple of times per month and smoking marijuana every now and then but has stopped cocaine use 3 months ago.  He was started on Wellbutrin 150 mg 12 hour tablet and advised to follow-up today. Has been smoking less down to 7-8 cigarettes from 15-20 cigarettes.  Wellbutrin has helped with anxiety.Has had some fatigue though has been very busy at work.  Past Medical History:  Diagnosis Date   Inguinal hernia    Past Surgical History:  Procedure Laterality Date   TONSILLECTOMY      Allergies  Allergen Reactions   Cat Hair Extract     Cat litter causes itching and sneezing    Dust Mite Extract     Itching and sneezing    Grass Pollen(K-O-R-T-Swt Vern) Itching   Pollen Extract-Tree Extract [Pollen Extract] Itching    Sneezing     Outpatient Encounter Medications as of 03/11/2023  Medication Sig   cetirizine (ZYRTEC) 10 MG tablet Take 10 mg by mouth daily.   omeprazole (PRILOSEC) 20 MG capsule Take 1 capsule (20 mg total) by mouth daily.   [DISCONTINUED] buPROPion (WELLBUTRIN SR) 150 MG 12 hr  tablet Take 1 tablet (150 mg total) by mouth daily.   buPROPion (WELLBUTRIN SR) 150 MG 12 hr tablet Take 1 tablet (150 mg total) by mouth daily.   No facility-administered encounter medications on file as of 03/11/2023.    Review of Systems  Constitutional:  Negative for appetite change, chills, fatigue, fever and unexpected weight change.  Eyes:  Negative for pain, discharge, redness, itching and visual disturbance.  Respiratory:  Negative for cough, chest tightness, shortness of breath and wheezing.   Cardiovascular:  Negative for chest pain, palpitations and leg swelling.  Gastrointestinal:  Negative for abdominal distention, abdominal pain, blood in stool, constipation, diarrhea, nausea and vomiting.       Acid reflux  Musculoskeletal:  Negative for arthralgias, back pain, gait problem, joint swelling, myalgias, neck pain and neck stiffness.  Skin:  Negative for color change, pallor, rash and wound.  Neurological:  Negative for dizziness, syncope, speech difficulty, weakness, light-headedness, numbness and headaches.  Psychiatric/Behavioral:  Negative for agitation, behavioral problems, confusion, hallucinations, self-injury, sleep disturbance and suicidal ideas. The patient is not nervous/anxious.     Immunization History  Administered Date(s) Administered   Td 07/09/2004   Tdap 11/22/2011, 07/26/2022   Pertinent  Health Maintenance Due  Topic Date Due   INFLUENZA VACCINE  08/29/2023 (Originally 12/30/2022)      02/08/2023    1:06 PM 03/11/2023    2:00 PM  Fall Risk  Falls in the past year? 0  0   Functional Status Survey:    Vitals:   03/11/23 1400  BP: 120/80  Pulse: 63  Resp: 16  Temp: 98 F (36.7 C)  SpO2: 97%  Weight: 227 lb 12.8 oz (103.3 kg)  Height: 5\' 9"  (1.753 m)   Body mass index is 33.64 kg/m. Physical Exam Vitals reviewed.  Constitutional:      General: He is not in acute distress.    Appearance: Normal appearance. He is obese. He is not  ill-appearing or diaphoretic.  HENT:     Head: Normocephalic.     Mouth/Throat:     Mouth: Mucous membranes are moist.     Pharynx: Oropharynx is clear. No oropharyngeal exudate or posterior oropharyngeal erythema.  Eyes:     General: No scleral icterus.       Right eye: No discharge.        Left eye: No discharge.     Conjunctiva/sclera: Conjunctivae normal.     Pupils: Pupils are equal, round, and reactive to light.  Neck:     Vascular: No carotid bruit.  Cardiovascular:     Rate and Rhythm: Normal rate and regular rhythm.     Pulses: Normal pulses.     Heart sounds: Normal heart sounds. No murmur heard.    No friction rub. No gallop.  Pulmonary:     Effort: Pulmonary effort is normal. No respiratory distress.     Breath sounds: Normal breath sounds. No wheezing, rhonchi or rales.  Chest:     Chest wall: No tenderness.  Abdominal:     General: Bowel sounds are normal. There is no distension.     Palpations: Abdomen is soft. There is no mass.     Tenderness: There is no abdominal tenderness. There is no right CVA tenderness, left CVA tenderness, guarding or rebound.  Musculoskeletal:        General: No swelling or tenderness. Normal range of motion.     Cervical back: Normal range of motion. No rigidity or tenderness.     Right lower leg: No edema.     Left lower leg: No edema.  Lymphadenopathy:     Cervical: No cervical adenopathy.  Skin:    General: Skin is warm and dry.     Coloration: Skin is not pale.     Findings: No bruising, erythema, lesion or rash.  Neurological:     Mental Status: He is alert and oriented to person, place, and time.     Cranial Nerves: No cranial nerve deficit.     Sensory: No sensory deficit.     Motor: No weakness.     Coordination: Coordination normal.     Gait: Gait normal.  Psychiatric:        Mood and Affect: Mood normal.        Speech: Speech normal.        Behavior: Behavior normal.        Thought Content: Thought content normal.         Judgment: Judgment normal.     Labs reviewed: Recent Labs    07/27/22 0000 02/04/23 1103  NA 138 139  K 4.4 4.3  CL 107 103  CO2 20 27  GLUCOSE 97 91  BUN 10 13  CREATININE 1.00 1.08  CALCIUM 9.7 10.6*   Recent Labs    07/27/22 0000 02/04/23 1103  AST 16 24  ALT 20 37  BILITOT 0.4 1.0  PROT 6.8 7.5   Recent Labs  07/27/22 0000 02/04/23 1103  WBC 9.0 7.7  NEUTROABS 5,562 4,127  HGB 17.0 17.2*  HCT 48.3 49.1  MCV 88.3 89.6  PLT 321 276   Lab Results  Component Value Date   TSH 2.51 02/04/2023   No results found for: "HGBA1C" Lab Results  Component Value Date   CHOL 263 (H) 02/04/2023   HDL 43 02/04/2023   LDLCALC 180 (H) 02/04/2023   TRIG 213 (H) 02/04/2023   CHOLHDL 6.1 (H) 02/04/2023    Significant Diagnostic Results in last 30 days:  No results found.  Assessment/Plan 1. Tobacco use disorder, continuous Wellbutrin has been effective has cut down from cigarette smoking from 15 to 20/day to 7 to 8 cigarettes/day. Advised to continue to cut down smoking - buPROPion (WELLBUTRIN SR) 150 MG 12 hr tablet; Take 1 tablet (150 mg total) by mouth daily.  Dispense: 60 tablet; Refill: 3  2. Generalized anxiety disorder Has improved with the Wellbutrin Likes to read fiction books recommended some books to read on how to deal with anxiety provided on AVS.  3. Gastroesophageal reflux disease without esophagitis Worsening symptoms no tarry or black stool reported -Will start on omeprazole as below -Advised to avoid foods that will aggravate symptoms such as spicy foods. -Notify provider if symptoms worsen or no improvement on omeprazole - omeprazole (PRILOSEC) 20 MG capsule; Take 1 capsule (20 mg total) by mouth daily.  Dispense: 30 capsule; Refill: 3  Family/ staff Communication: Reviewed plan of care with patient verbalized understanding   Labs/tests ordered: None   Next Appointment: Return if symptoms worsen or fail to improve.   Caesar Bookman, NP

## 2023-07-12 ENCOUNTER — Other Ambulatory Visit: Payer: Self-pay | Admitting: Family

## 2023-07-12 DIAGNOSIS — K219 Gastro-esophageal reflux disease without esophagitis: Secondary | ICD-10-CM

## 2023-07-29 ENCOUNTER — Encounter: Payer: 59 | Admitting: Family

## 2023-08-09 ENCOUNTER — Telehealth: Payer: Self-pay

## 2023-08-09 NOTE — Telephone Encounter (Signed)
 Copied from CRM [id number]. Topic: Clinical - Medical Advice >> Aug 09, 2023 12:09 PM Dennison Nancy wrote: Reason for CRM: Patient need advice have question regarding getting labs done , he currently is self pay and do not have insurance patient needing estimate amount what his cost would be ,  Please call patient at [phone]   Spoke with patient to let him know that he would come to his appointment and let Ngetich, Dinah C, NP and the lab technician that he is paying out of pocket to get lab work done.

## 2023-08-16 ENCOUNTER — Encounter: Payer: Self-pay | Admitting: Family

## 2023-08-16 ENCOUNTER — Ambulatory Visit (INDEPENDENT_AMBULATORY_CARE_PROVIDER_SITE_OTHER): Payer: Self-pay | Admitting: Family

## 2023-08-16 VITALS — BP 126/74 | HR 88 | Temp 97.7°F | Resp 19 | Ht 69.0 in | Wt 237.2 lb

## 2023-08-16 DIAGNOSIS — Z87891 Personal history of nicotine dependence: Secondary | ICD-10-CM

## 2023-08-16 DIAGNOSIS — L246 Irritant contact dermatitis due to food in contact with skin: Secondary | ICD-10-CM

## 2023-08-16 DIAGNOSIS — K219 Gastro-esophageal reflux disease without esophagitis: Secondary | ICD-10-CM | POA: Insufficient documentation

## 2023-08-16 DIAGNOSIS — F411 Generalized anxiety disorder: Secondary | ICD-10-CM | POA: Diagnosis not present

## 2023-08-16 DIAGNOSIS — F17200 Nicotine dependence, unspecified, uncomplicated: Secondary | ICD-10-CM | POA: Insufficient documentation

## 2023-08-16 DIAGNOSIS — Z789 Other specified health status: Secondary | ICD-10-CM

## 2023-08-16 DIAGNOSIS — E782 Mixed hyperlipidemia: Secondary | ICD-10-CM

## 2023-08-16 MED ORDER — OMEPRAZOLE 20 MG PO CPDR: 20.0000 mg | DELAYED_RELEASE_CAPSULE | Freq: Every day | ORAL | 5 refills | Status: DC

## 2023-08-16 MED ORDER — BUPROPION HCL ER (SR) 150 MG PO TB12: 150.0000 mg | ORAL_TABLET | Freq: Every day | ORAL | 3 refills | Status: DC

## 2023-08-16 MED ORDER — TRIAMCINOLONE ACETONIDE 0.1 % EX CREA: 1.0000 | TOPICAL_CREAM | Freq: Two times a day (BID) | CUTANEOUS | 3 refills | Status: DC

## 2023-08-16 NOTE — Progress Notes (Signed)
 Provider: Richarda Blade FNP-C   Avaiah Stempel, Donalee Citrin, NP  Patient Care Team: Anthony Tamburo, Donalee Citrin, NP as PCP - General (Family Medicine)  Extended Emergency Contact Information Primary Emergency Contact: Axel Filler [place] Home Phone: [phone] Relation: Mother Secondary Emergency Contact: Schneider,Remick Mobile Phone: [phone] Relation: Relative Preferred language: English Interpreter needed? No  Code Status:  Full Code  Goals of care: Advanced Directive information    08/16/2023   10:00 AM  Advanced Directives  Does Patient Have a Medical Advance Directive? No  Would patient like information on creating a medical advance directive? No - Patient declined     Chief Complaint  Patient presents with   Annual Exam    Physical      Discussed the use of AI scribe software for clinical note transcription with the patient, who gave verbal consent to proceed.  History of Present Illness   Curtis Woods is a 38 year old male who presents for an annual physical exam.  He has made significant lifestyle changes since his last visit, including quitting smoking cigarettes and now only vaping occasionally. He has started exercising three to four days a week, focusing on resistance and strength training with some cardio. His diet has also changed, incorporating nonfat Greek yogurt daily for protein and probiotics, and reducing sweets and sugars. He typically consumes a protein bar and two cups of coffee with a little creamer in the morning. Alcohol consumption is infrequent, about once a month, with around five beers each time.  He has a history of elevated cholesterol levels, with a total cholesterol of 263 mg/dL, LDL of 962 mg/dL, and triglycerides of 952 mg/dL. He hopes that his dietary changes will improve these levels.  He continues to manage chronic gastroesophageal reflux disease (GERD) with omeprazole, which he has been using long-term. He attempted to  discontinue it in January but experienced worsening symptoms. He is considering alternative management options.  He has been experiencing skin irritation on his hands, which his partner believes is contact dermatitis. 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 The irritation is primarily on the top of his hands, and he uses Aquaphor and CeraVe moisturizing lotion to manage the symptoms. 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  He is managing anxiety with Wellbutrin, which has also helped him quit smoking and reduce vaping. He reports improvement in anxiety symptoms with the current treatment.  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 He does not have health insurance currently but expects to have it through his new job by May. He has Sundays and Tuesdays off from work. No pain or tenderness in the back or sinuses, no swelling in the legs, and no tenderness in the abdomen. He has not visited the dentist this year but maintains vision and dental insurance.   Past Medical History:  Diagnosis Date   Inguinal hernia    Past Surgical History:  Procedure Laterality Date   TONSILLECTOMY      Allergies  Allergen Reactions   Cat Dander     Cat litter causes itching and sneezing    Dust Mite Extract     Itching and sneezing    Grass Pollen(K-O-R-T-Swt Vern) Itching   Pollen Extract-Tree Extract [Pollen Extract] Itching    Sneezing     Allergies as of 08/16/2023  Reactions   Cat Dander    Cat litter causes itching and sneezing    Dust Mite Extract    Itching and sneezing    Grass Pollen(k-o-r-t-swt Vern) Itching   Pollen Extract-tree Extract [pollen Extract] Itching   Sneezing         Medication List        Accurate as of August 16, 2023  9:44 PM. If you have any questions, ask your nurse or doctor.          buPROPion 150  MG 12 hr tablet Commonly known as: WELLBUTRIN SR Take 1 tablet (150 mg total) by mouth daily.   cetirizine 10 MG tablet Commonly known as: ZYRTEC Take 10 mg by mouth daily.   Creatine Monohydrate Powd 5 mg by Does not apply route daily.   multivitamin with minerals Tabs tablet Take 1 tablet by mouth daily.   omeprazole 20 MG capsule Commonly known as: PRILOSEC Take 1 capsule (20 mg total) by mouth daily.   triamcinolone cream 0.1 % Commonly known as: KENALOG Apply 1 Application topically 2 (two) times daily. Started by: Donalee Citrin Meliah Appleman        Review of Systems  Constitutional:  Negative for appetite change, chills, fatigue, fever and unexpected weight change.  HENT:  Negative for congestion, dental problem, ear discharge, ear pain, facial swelling, hearing loss, nosebleeds, postnasal drip, rhinorrhea, sinus pressure, sinus pain, sneezing, sore throat, tinnitus and trouble swallowing.   Eyes:  Negative for pain, discharge, redness, itching and visual disturbance.  Respiratory:  Negative for cough, chest tightness, shortness of breath and wheezing.   Cardiovascular:  Negative for chest pain, palpitations and leg swelling.  Gastrointestinal:  Negative for abdominal distention, abdominal pain, blood in stool, constipation, diarrhea, nausea and vomiting.  Endocrine: Negative for cold intolerance, heat intolerance, polydipsia, polyphagia and polyuria.  Genitourinary:  Negative for difficulty urinating, dysuria, flank pain, frequency and urgency.  Musculoskeletal:  Negative for arthralgias, back pain, gait problem, joint swelling, myalgias, neck pain and neck stiffness.  Skin:  Positive for rash. Negative for color change, pallor and wound.       Back of hands   Neurological:  Negative for dizziness, syncope, speech difficulty, weakness, light-headedness, numbness and headaches.  Hematological:  Does not bruise/bleed easily.  Psychiatric/Behavioral:  Negative for agitation,  behavioral problems, confusion, hallucinations, self-injury, sleep disturbance and suicidal ideas. The patient is nervous/anxious.        Anxiety has improved     Immunization History  Administered Date(s) Administered   Td 07/09/2004   Tdap 11/22/2011, 07/26/2022   Pertinent  Health Maintenance Due  Topic Date Due   INFLUENZA VACCINE  08/29/2023 (Originally 12/30/2022)      02/08/2023    1:06 PM 03/11/2023    2:00 PM 08/16/2023    9:59 AM  Fall Risk  Falls in the past year? 0 0 0  Was there an injury with Fall?   0  Fall Risk Category Calculator   0  Patient at Risk for Falls Due to   No Fall Risks  Fall risk Follow up   Falls evaluation completed   Functional Status Survey:    Vitals:   08/16/23 1011  BP: 126/74  Pulse: 88  Resp: 19  Temp: 97.7 F (36.5 C)  SpO2: 98%  Weight: 237 lb 3.2 oz (107.6 kg)  Height: 5\' 9"  (1.753 m)   Body mass index is 35.03 kg/m. Physical Exam  VITALS: T- 97.7, P- 88, BP- 126/74, SaO2-  98% GENERAL: Alert, cooperative, well developed, no acute distress HEENT: Normocephalic, normal oropharynx, moist mucous membranes, ears normal, nose normal, no sinus tenderness CHEST: Clear to auscultation bilaterally, no wheezes, rhonchi, or crackles CARDIOVASCULAR: Normal heart rate and rhythm, S1 and S2 normal without murmurs ABDOMEN: Soft, non-tender, non-distended, without organomegaly, normal bowel sounds EXTREMITIES: No cyanosis or edema, no leg swelling MUSCULOSKELETAL: No knee crepitus NEUROLOGICAL: Cranial nerves grossly intact, moves all extremities without gross motor or sensory deficit SKIN: Dry Redness on back of hand and palm PSYCHIATRY/BEHAVIORAL: Mood stable   Labs reviewed: Recent Labs    02/04/23 1103  NA 139  K 4.3  CL 103  CO2 27  GLUCOSE 91  BUN 13  CREATININE 1.08  CALCIUM 10.6*   Recent Labs    02/04/23 1103  AST 24  ALT 37  BILITOT 1.0  PROT 7.5   Recent Labs    02/04/23 1103  WBC 7.7  NEUTROABS 4,127   HGB 17.2*  HCT 49.1  MCV 89.6  PLT 276   Lab Results  Component Value Date   TSH 2.51 02/04/2023   No results found for: "HGBA1C" Lab Results  Component Value Date   CHOL 263 (H) 02/04/2023   HDL 43 02/04/2023   LDLCALC 180 (H) 02/04/2023   TRIG 213 (H) 02/04/2023   CHOLHDL 6.1 (H) 02/04/2023    Significant Diagnostic Results in last 30 days:  No results found.  Assessment/Plan  Annual Physical Examination Routine annual physical examination. Vital signs are within normal limits. He has made significant lifestyle changes including quitting smoking, reducing alcohol intake, and starting a regular exercise routine. He has also improved his diet to manage cholesterol levels and prevent diabetes. - Continue current lifestyle modifications including exercise and diet changes - Schedule lab work for lipid panel, TSH, CBC, and CMP  Hyperlipidemia Previously elevated cholesterol levels with total cholesterol at 263 mg/dL, LDL at 725 mg/dL, and triglycerides at 366 mg/dL. He has made dietary changes and increased physical activity since last visit. Discussed the importance of fasting before the lipid panel to ensure accurate results. - Order fasting lipid panel to reassess cholesterol levels - Advise him to continue dietary modifications and exercise regimen  Gastroesophageal Reflux Disease (GERD) Chronic GERD managed with omeprazole. He reports difficulty discontinuing omeprazole due to severe acid reflux symptoms. Discussed potential long-term effects of omeprazole on vitamin absorption and the possibility of switching to famotidine. - Advise him to try reducing omeprazole to every other day - Consider switching to famotidine if symptoms persist - Educate him on avoiding foods that exacerbate reflux  Anxiety Anxiety symptoms are well-managed with Wellbutrin. He reports improvement in anxiety and smoking cessation efforts. - Continue Wellbutrin - Refill prescription for  Wellbutrin  Contact Dermatitis Intermittent skin irritation on the hands, likely due to occupational exposure and use of nitrile gloves. Symptoms include redness and irritation on the back of the hands. He uses Aquaphor and CeraVe for moisturizing. - Prescribe triamcinolone cream for flare-ups - Advise him to keep hands moisturized and monitor for worsening symptoms  Former cigarette smoker  Has quit smoking but now vapes every now and then. Cessation advised  - continue on Wellbutrin   General Health Maintenance He is taking a multivitamin and creatine for muscle recovery and growth. - Encourage continued adherence to lifestyle modifications - Advise on the importance of regular dental and vision check-ups  Follow-up He needs to follow up for lab work and medication refills. Discussed financial concerns regarding lab work  due to lack of insurance. He is considering financial assistance options for lab work. - Schedule lab work for next Tuesday - Provide information on potential financial assistance for lab work - Refill prescriptions for omeprazole and Wellbutrin   Family/ staff Communication: Reviewed plan of care with patient verbalized understanding   Labs/tests ordered: - CBC with Differential/Platelet - CMP with eGFR([facility]) - TSH - Lipid panel  Next Appointment : Return in about 6 months (around 02/16/2024) for medical mangement of chronic issues., fasting labs in one week.   Spent 30 minutes of Face to face and non-face to face with patient  >50% time spent counseling; reviewing medical record; tests; labs; documentation and developing future plan of care.   Caesar Bookman, NP

## 2024-02-21 ENCOUNTER — Ambulatory Visit: Payer: Self-pay | Admitting: Family

## 2024-02-21 ENCOUNTER — Encounter: Payer: Self-pay | Admitting: Family

## 2024-02-21 VITALS — BP 124/80 | HR 84 | Temp 97.8°F | Resp 19 | Ht 69.0 in | Wt 241.6 lb

## 2024-02-21 DIAGNOSIS — F1090 Alcohol use, unspecified, uncomplicated: Secondary | ICD-10-CM

## 2024-02-21 DIAGNOSIS — Z87891 Personal history of nicotine dependence: Secondary | ICD-10-CM

## 2024-02-21 DIAGNOSIS — K219 Gastro-esophageal reflux disease without esophagitis: Secondary | ICD-10-CM

## 2024-02-21 DIAGNOSIS — F411 Generalized anxiety disorder: Secondary | ICD-10-CM

## 2024-02-21 DIAGNOSIS — E782 Mixed hyperlipidemia: Secondary | ICD-10-CM

## 2024-02-21 MED ORDER — OMEPRAZOLE 20 MG PO CPDR: 20.0000 mg | DELAYED_RELEASE_CAPSULE | Freq: Every day | ORAL | 5 refills | Status: AC

## 2024-02-21 MED ORDER — BUPROPION HCL ER (SR) 150 MG PO TB12: 150.0000 mg | ORAL_TABLET | Freq: Every day | ORAL | 3 refills | Status: AC

## 2024-02-21 NOTE — Patient Instructions (Signed)
 1.Go to pharmacy to receive Covid & HPV vaccines.

## 2024-02-24 ENCOUNTER — Other Ambulatory Visit: Payer: Self-pay

## 2024-02-24 DIAGNOSIS — E782 Mixed hyperlipidemia: Secondary | ICD-10-CM

## 2024-02-24 DIAGNOSIS — F411 Generalized anxiety disorder: Secondary | ICD-10-CM

## 2024-02-24 DIAGNOSIS — K219 Gastro-esophageal reflux disease without esophagitis: Secondary | ICD-10-CM

## 2024-02-25 LAB — COMPREHENSIVE METABOLIC PANEL WITH GFR
AG Ratio: 1.8 (calc) (ref 1.0–2.5)
ALT: 29 U/L (ref 9–46)
AST: 20 U/L (ref 10–40)
Albumin: 4.6 g/dL (ref 3.6–5.1)
Alkaline phosphatase (APISO): 54 U/L (ref 36–130)
BUN: 25 mg/dL (ref 7–25)
CO2: 25 mmol/L (ref 20–32)
Calcium: 9.8 mg/dL (ref 8.6–10.3)
Chloride: 102 mmol/L (ref 98–110)
Creat: 0.93 mg/dL (ref 0.60–1.26)
Globulin: 2.5 g/dL (ref 1.9–3.7)
Glucose, Bld: 96 mg/dL (ref 65–99)
Potassium: 4 mmol/L (ref 3.5–5.3)
Sodium: 136 mmol/L (ref 135–146)
Total Bilirubin: 0.7 mg/dL (ref 0.2–1.2)
Total Protein: 7.1 g/dL (ref 6.1–8.1)
eGFR: 109 mL/min/{1.73_m2}

## 2024-02-25 LAB — LIPID PANEL
Cholesterol: 228 mg/dL — ABNORMAL HIGH (ref <200)
HDL: 41 mg/dL (ref >=40)
LDL Cholesterol (Calc): 156 mg/dL — ABNORMAL HIGH
Non-HDL Cholesterol (Calc): 187 mg/dL — ABNORMAL HIGH (ref <130)
Total CHOL/HDL Ratio: 5.6 (calc) — ABNORMAL HIGH (ref <5.0)
Triglycerides: 177 mg/dL — ABNORMAL HIGH (ref <150)

## 2024-02-25 LAB — CBC WITH DIFFERENTIAL/PLATELET
Absolute Lymphocytes: 2832 {cells}/uL (ref 850–3900)
Absolute Monocytes: 600 {cells}/uL (ref 200–950)
Basophils Absolute: 48 {cells}/uL (ref 0–200)
Basophils Relative: 0.6 %
Eosinophils Absolute: 768 {cells}/uL — ABNORMAL HIGH (ref 15–500)
Eosinophils Relative: 9.6 %
HCT: 47.6 % (ref 38.5–50.0)
Hemoglobin: 15.9 g/dL (ref 13.2–17.1)
MCH: 29.4 pg (ref 27.0–33.0)
MCHC: 33.4 g/dL (ref 32.0–36.0)
MCV: 88.1 fL (ref 80.0–100.0)
MPV: 10.2 fL (ref 7.5–12.5)
Monocytes Relative: 7.5 %
Neutro Abs: 3752 {cells}/uL (ref 1500–7800)
Neutrophils Relative %: 46.9 %
Platelets: 265 Thousand/uL (ref 140–400)
RBC: 5.4 Million/uL (ref 4.20–5.80)
RDW: 13.2 % (ref 11.0–15.0)
Total Lymphocyte: 35.4 %
WBC: 8 Thousand/uL (ref 3.8–10.8)

## 2024-02-25 LAB — TSH: TSH: 3.67 m[IU]/L (ref 0.40–4.50)

## 2024-02-26 NOTE — Progress Notes (Signed)
 Provider: Roxan Plough FNP-C   Crewe Heathman, Roxan BROCKS, NP  Patient Care Team: Jennefer Kopp, Roxan BROCKS, NP as PCP - General (Family Medicine)  Extended Emergency Contact Information Primary Emergency Contact: [place], [place] Home Phone: [phone] Relation: Mother Secondary Emergency Contact: Schneider,Remick Mobile Phone: [phone] Relation: Relative Preferred language: English Interpreter needed? No  Code Status:  Full Code  Goals of care: Advanced Directive information    02/21/2024   11:10 AM  Advanced Directives  Does Patient Have a Medical Advance Directive? No  Would patient like information on creating a medical advance directive? No - Patient declined     Chief Complaint  Patient presents with   Medical Management of Chronic Issues    6 Month follow up.    HPI:  Pt is a 38 y.o. male seen today for medical management of chronic diseases.   Discussed the use of AI scribe software for clinical note transcription with the patient, who gave verbal consent to proceed.  History of Present Illness   Curtis Woods is a 38 year old male who presents for a six-month follow-up visit.  He has gained weight, now weighing 241.6 pounds, up from 237 pounds, which he attributes to increased muscle mass from weightlifting and bodybuilding. He exercises three to four times a week, starting with cardio followed by one and a half to two hours of weight training. He has made significant dietary changes, eating healthier and increasing his protein intake while reducing carbohydrates and sugars.  He is currently taking Zyrtec for allergies, a multivitamin, creatine monohydrate powder (increased to 10 mg daily), omega-3 fatty acids, omeprazole  20 mg, ashwagandha 1000 mg daily, and Wellbutrin  150 mg daily. He no longer uses triamcinolone  cream, which was previously used for a burn on his palm. He has quit smoking, having only smoked one cigarette in the past eight months,  and drinks alcohol about once a month.  He wants to have blood work done to assess the impact of his lifestyle changes, particularly concerned about previously high calcium levels. He consumes a lot of lactose-free milk and dairy products, which he acknowledges may affect his calcium levels.  He feels better overall, with improved joint health since starting his exercise regimen. He has a family history of diabetes on both sides and is motivated to prevent it through diet and exercise. 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  No issues with allergies beyond his usual symptoms.   Past Medical History:  Diagnosis Date   Inguinal hernia    Past Surgical History:  Procedure Laterality Date   TONSILLECTOMY      Allergies  Allergen Reactions   Cat Dander     Cat litter causes itching and sneezing    Dust Mite Extract     Itching and sneezing    Grass Pollen(K-O-R-T-Swt Vern) Itching   Pollen Extract-Tree Extract [Pollen Extract] Itching    Sneezing     Allergies as of 02/21/2024       Reactions   Cat Dander    Cat litter causes itching and sneezing    Dust Mite Extract    Itching and sneezing    Grass Pollen(k-o-r-t-swt Vern) Itching   Pollen Extract-tree Extract [pollen Extract] Itching   Sneezing         Medication List        Accurate as of February 21, 2024 11:59 PM. If you have any questions, ask your nurse or doctor.          STOP taking these medications    triamcinolone  cream 0.1 % Commonly known as: KENALOG  Stopped by: Azion Centrella C Lizbeth Feijoo       TAKE these medications    Ashwagandha 500 MG Caps Take 1,000 mg by mouth daily.   buPROPion  150 MG 12 hr tablet Commonly known as: WELLBUTRIN  SR Take 1 tablet (150 mg total) by mouth daily.   cetirizine 10 MG tablet Commonly known as: ZYRTEC Take 10 mg by mouth daily.   Creatine Monohydrate  Powd 10 mg by Does not apply route daily.   FISH OIL PO Take 1,400 mg by mouth daily.   multivitamin with minerals Tabs tablet Take 1 tablet by mouth daily.   omeprazole  20 MG capsule Commonly known as: PRILOSEC Take 1 capsule (20 mg total) by mouth daily.        Review of Systems  Constitutional:  Negative for appetite change, chills, fatigue, fever and unexpected weight change.  HENT:  Negative for congestion, dental problem, ear discharge, ear pain, facial swelling, hearing loss, nosebleeds, postnasal drip, rhinorrhea, sinus pressure, sinus pain, sneezing, sore throat, tinnitus and trouble swallowing.   Eyes:  Negative for pain, discharge, redness, itching and visual disturbance.  Respiratory:  Negative for cough, chest tightness, shortness of breath and wheezing.   Cardiovascular:  Negative for chest pain, palpitations and leg swelling.  Gastrointestinal:  Negative for abdominal distention, abdominal pain, blood in stool, constipation, diarrhea, nausea and vomiting.  Endocrine: Negative for cold intolerance, heat intolerance, polydipsia, polyphagia and polyuria.  Genitourinary:  Negative for difficulty urinating, dysuria, flank pain, frequency and urgency.  Musculoskeletal:  Negative for arthralgias, back pain, gait problem, joint swelling, myalgias, neck pain and neck stiffness.  Skin:  Negative for color change, pallor, rash and wound.  Neurological:  Negative for dizziness, syncope, speech difficulty, weakness, light-headedness, numbness and headaches.  Hematological:  Does not bruise/bleed easily.  Psychiatric/Behavioral:  Negative for agitation, behavioral problems, confusion, hallucinations, self-injury, sleep disturbance and suicidal ideas. The patient is not nervous/anxious.     Immunization History  Administered Date(s) Administered   Hepatitis B 02/19/1999, 03/25/1999, 08/21/1999   Meningococcal Acwy, Unspecified 11/12/2005   Pfizer Covid-19 Vaccine Bivalent Booster  71yrs & up 09/06/2019, 11/08/2019   Td 07/09/2004   Tdap 11/22/2011, 07/26/2022   Pertinent  Health Maintenance Due  Topic Date Due   Influenza Vaccine  06/19/2024 (Originally 12/30/2023)      02/08/2023    1:06 PM 03/11/2023    2:00 PM 08/16/2023    9:59 AM 02/21/2024   11:09 AM  Fall Risk  Falls in the past year? 0 0 0 0  Was there an injury with Fall?   0 0  Fall Risk Category Calculator   0 0  Patient at Risk for Falls Due to   No Fall Risks No Fall Risks  Fall risk Follow up   Falls evaluation completed Falls evaluation completed   Functional Status Survey:    Vitals:   02/21/24 1117  BP: 124/80  Pulse: 84  Resp: 19  Temp: 97.8 F (36.6 C)  SpO2: 97%  Weight: 241 lb 9.6 oz (109.6 kg)  Height: 5' 9 (1.753 m)   Body mass index is 35.68 kg/m. Physical Exam VITALS: T- 97.8, P- 84, BP- 124/80, SaO2- 99% MEASUREMENTS: Weight- 241.6. GENERAL: Alert, cooperative, well developed, no acute distress HEENT: Normocephalic, normal oropharynx, moist mucous  membranes, ears normal, nose normal, no sinus tenderness CHEST: Clear to auscultation bilaterally, no wheezes, rhonchi, or crackles CARDIOVASCULAR: Normal heart rate and rhythm, S1 and S2 normal without murmurs ABDOMEN: Soft, non-tender, non-distended, without organomegaly, normal bowel sounds EXTREMITIES: No cyanosis or edema, no swelling in extremities MUSCULOSKELETAL: Normal range of motion in legs, no back tenderness NEUROLOGICAL: Cranial nerves grossly intact, moves all extremities without gross motor or sensory deficit   Labs reviewed: Recent Labs    02/24/24 0938  NA 136  K 4.0  CL 102  CO2 25  GLUCOSE 96  BUN 25  CREATININE 0.93  CALCIUM 9.8   Recent Labs    02/24/24 0938  AST 20  ALT 29  BILITOT 0.7  PROT 7.1   Recent Labs    02/24/24 0938  WBC 8.0  NEUTROABS 3,752  HGB 15.9  HCT 47.6  MCV 88.1  PLT 265   Lab Results  Component Value Date   TSH 3.67 02/24/2024   No results found for:  HGBA1C Lab Results  Component Value Date   CHOL 228 (H) 02/24/2024   HDL 41 02/24/2024   LDLCALC 156 (H) 02/24/2024   TRIG 177 (H) 02/24/2024   CHOLHDL 5.6 (H) 02/24/2024    Significant Diagnostic Results in last 30 days:  No results found.  Assessment/Plan  Mixed hyperlipidemia Significant lifestyle changes, including increased exercise and dietary modifications, may positively impact lipid levels. - Schedule fasting blood work for Friday to assess lipid levels and other parameters.  Gastroesophageal reflux disease GERD is managed with omeprazole  20 mg. - Send refill for omeprazole  20 mg to pharmacy.  Generalized anxiety disorder Anxiety is managed with Wellbutrin  150 mg daily. He reports doing okay with anxiety, using mindfulness techniques to manage external stressors. - Send refill for Wellbutrin  150 mg to pharmacy.  Allergic rhinitis Managed with Zyrtec. No new issues reported with allergies.  General Health Maintenance He has quit smoking, with only one cigarette in the past eight months. Alcohol consumption is limited to once a month. Increased physical activity and improved dietary habits to prevent diabetes, given family history. - Continue current exercise regimen and dietary modifications to prevent diabetes. - Schedule six-month follow-up appointment.   Family/ staff Communication: Reviewed plan of care with patient verbalized understanding   Labs/tests ordered: None   Next Appointment : Return in about 6 months (around 08/20/2024) for medical mangement of chronic issues.Fasting Labs this week. SABRA   Spent 30 minutes of Face to face and non-face to face with patient  >50% time spent counseling; reviewing medical record; tests; labs; documentation and developing future plan of care.   Roxan JAYSON Plough, NP

## 2024-03-06 ENCOUNTER — Ambulatory Visit: Payer: Self-pay | Admitting: Family

## 2024-08-22 ENCOUNTER — Ambulatory Visit: Payer: Self-pay | Admitting: Family
